# Patient Record
Sex: Female | Born: 1970 | Race: White | Hispanic: No | Marital: Married | Smoking: Former smoker
Health system: Southern US, Community
[De-identification: ages and names within clinical notes are randomized; demographics above are authoritative.]

## PROBLEM LIST (undated history)

## (undated) DIAGNOSIS — F988 Other specified behavioral and emotional disorders with onset usually occurring in childhood and adolescence: Secondary | ICD-10-CM

## (undated) DIAGNOSIS — M545 Low back pain, unspecified: Secondary | ICD-10-CM

## (undated) HISTORY — PX: ABDOMINOPLASTY: SHX5355

## (undated) HISTORY — PX: ROTATOR CUFF REPAIR: SHX139

---

## 2011-06-03 ENCOUNTER — Emergency Department
Admit: 2011-06-03 | Discharge: 2011-06-03 | Disposition: A | Discharge status: Home / Self Care | Payer: BC Managed Care – PPO | Attending: Family Medicine | Admitting: Family Medicine

## 2011-06-03 ENCOUNTER — Emergency Department (INDEPENDENT_AMBULATORY_CARE_PROVIDER_SITE_OTHER)
Admission: EM | Admit: 2011-06-03 | Discharge: 2011-06-03 | Disposition: A | Discharge status: Home / Self Care | Payer: BC Managed Care – PPO | Source: Home / Self Care | Attending: Family Medicine | Admitting: Family Medicine

## 2011-06-03 ENCOUNTER — Encounter: Payer: Self-pay | Admitting: *Deleted

## 2011-06-03 DIAGNOSIS — J209 Acute bronchitis, unspecified: Secondary | ICD-10-CM

## 2011-06-03 HISTORY — DX: Other specified behavioral and emotional disorders with onset usually occurring in childhood and adolescence: F98.8

## 2011-06-03 MED ORDER — SULFAMETHOXAZOLE-TRIMETHOPRIM 800-160 MG PO TABS: 1.0000 | ORAL_TABLET | Freq: Two times a day (BID) | ORAL | Status: AC

## 2011-06-03 MED ORDER — HYDROCOD POLST-CHLORPHEN POLST 10-8 MG/5ML PO LQCR: 5.0000 mL | Freq: Every evening | ORAL | Status: DC | PRN

## 2011-06-03 NOTE — ED Notes (Signed)
Patient c/o productive cough x 2 weeks. She has taken hycodan, tessalon and delsym. Also c/o dull pain in left mid back that started after the cough.

## 2011-06-03 NOTE — ED Provider Notes (Signed)
History     CSN: [id number]  Arrival date & time 06/03/11  1044   First MD Initiated Contact with Patient 06/03/11 1126      Chief Complaint  Patient presents with  . Cough      HPI Comments: Patient developed mild URI symptoms about a month ago, treated with a Z-pack by her PCP.  She has had a persistent mild cough which has become worse over the past two weeks, and she has developed a pain in her left scapular area, worse when coughing.  The cough is worse at night, not responding to Hycodan, Delsym, etc.  No fevers, chills, and sweats.  She often coughs until she gags (for about two days).  She had pneumonia about two years ago.  She smokes two cigarettes at bedtime. She reports that she had a booster Tdap one month ago.  The history is provided by the patient.    Past Medical History  Diagnosis Date  . ADD (attention deficit disorder)     Past Surgical History  Procedure Date  . Rotator cuff repair     left  . Abdominoplasty     Family History  Problem Relation Age of Onset  . Cancer Mother     lung    History  Substance Use Topics  . Smoking status: Current Everyday Smoker -- 0.2 packs/day    Types: Cigarettes  . Smokeless tobacco: Not on file  . Alcohol Use: No    OB History    Grav Para Term Preterm Abortions TAB SAB Ect Mult Living                  Review of Systems No sore throat at present + cough; occasionally gags No pleuritic pain, but complains of pain in left scapular area for two weeks. + wheezing at night No nasal congestion No post-nasal drainage No sinus pain/pressure No itchy/red eyes No earache No hemoptysis No SOB No fever/chills No nausea No vomiting No abdominal pain No diarrhea No urinary symptoms No skin rashes No fatigue No myalgias No headache Used OTC and prescription meds without relief  Allergies  Penicillins  Home Medications   Current Outpatient Rx  Name Route Sig Dispense Refill  . CITALOPRAM  HYDROBROMIDE 40 MG PO TABS Oral Take 40 mg by mouth daily.    Marland Kitchen ZOLPIDEM TARTRATE 10 MG PO TABS Oral Take 10 mg by mouth at bedtime as needed.    Marland Kitchen HYDROCOD POLST-CPM POLST ER 10-8 MG/5ML PO LQCR Oral Take 5 mLs by mouth at bedtime as needed. 115 mL 0  . SULFAMETHOXAZOLE-TRIMETHOPRIM 800-160 MG PO TABS Oral Take 1 tablet by mouth 2 (two) times daily. 28 tablet 0    BP 118/83  Pulse 80  Temp(Src) 98.9 F (37.2 C) (Oral)  Resp 14  Ht 5\' 4"  (1.626 m)  Wt 202 lb (91.627 kg)  BMI 34.67 kg/m2  SpO2 99%  Physical Exam Nursing notes and Vital Signs reviewed. Appearance:  Patient appears healthy, stated age, and in no acute distress Eyes:  Pupils are equal, round, and reactive to light and accomodation.  Extraocular movement is intact.  Conjunctivae are not inflamed  Ears:  Canals normal.  Tympanic membranes normal.  Nose:  Mildly congested turbinates.  No sinus tenderness.   Pharynx:  Normal Neck:  Supple.   No adenopathy Lungs:  Clear to auscultation.  Breath sounds are equal.  Heart:  Regular rate and rhythm without murmurs, rubs, or gallops.  Abdomen:  Nontender  without masses or hepatosplenomegaly.  Bowel sounds are present.  No CVA or flank tenderness.  Extremities:  No edema.  No calf tenderness Skin:  No rash present.   ED Course  Procedures  none  Labs Reviewed - No data to display Dg Chest 2 View  06/03/2011  *RADIOLOGY REPORT*  Clinical Data: Cough for 2 weeks.  CHEST - 2 VIEW  Comparison: None.  Findings: The heart size and mediastinal contours are normal. The lungs are clear. There is no pleural effusion or pneumothorax. No acute osseous findings are identified.  IMPRESSION: No active cardiopulmonary process.  Original Report Authenticated By: Gerrianne Scale, M.D.     1. Acute bronchitis       MDM  ? Pertussis Begin Septra DS for two weeks.  Tussionex at bedtime Take plain Mucinex (guaifenesin) twice daily for cough and congestion.  Increase fluid intake,  rest. Followup with pulmonologist if not improving        Donna Christen, MD 06/03/11 1244

## 2012-02-08 ENCOUNTER — Emergency Department (INDEPENDENT_AMBULATORY_CARE_PROVIDER_SITE_OTHER)
Admission: EM | Admit: 2012-02-08 | Discharge: 2012-02-08 | Disposition: A | Discharge status: Home / Self Care | Payer: BC Managed Care – PPO | Source: Home / Self Care

## 2012-02-08 ENCOUNTER — Encounter: Payer: Self-pay | Admitting: *Deleted

## 2012-02-08 ENCOUNTER — Emergency Department (INDEPENDENT_AMBULATORY_CARE_PROVIDER_SITE_OTHER): Payer: BC Managed Care – PPO

## 2012-02-08 DIAGNOSIS — M549 Dorsalgia, unspecified: Secondary | ICD-10-CM

## 2012-02-08 DIAGNOSIS — R079 Chest pain, unspecified: Secondary | ICD-10-CM

## 2012-02-08 DIAGNOSIS — M545 Low back pain, unspecified: Secondary | ICD-10-CM

## 2012-02-08 DIAGNOSIS — T148XXA Other injury of unspecified body region, initial encounter: Secondary | ICD-10-CM

## 2012-02-08 DIAGNOSIS — R0781 Pleurodynia: Secondary | ICD-10-CM

## 2012-02-08 HISTORY — DX: Low back pain: M54.5

## 2012-02-08 HISTORY — DX: Low back pain, unspecified: M54.50

## 2012-02-08 MED ORDER — KETOROLAC TROMETHAMINE 60 MG/2ML IM SOLN
60.0000 mg | Freq: Once | INTRAMUSCULAR | Status: AC
  Administered 2012-02-08: 60 mg via INTRAMUSCULAR

## 2012-02-08 NOTE — ED Provider Notes (Signed)
History     CSN: [id number]  Arrival date & time 02/08/12  1204   First MD Initiated Contact with Patient 02/08/12 1233      Chief Complaint  Patient presents with  . Back Pain   Patient is a 41 y.o. female presenting with back pain.  Back Pain  This is a recurrent (Pt with hx/o recurrent low back pain. Currently being treated by PCP. On vicodin and flexeril. ) problem. The problem occurs constantly. The pain is associated with lifting heavy objects. The pain is present in the thoracic spine (L rib cage ). The quality of the pain is described as aching. Radiates to: around L rib cage.  The pain is mild. The symptoms are aggravated by twisting and certain positions. Pertinent negatives include no chest pain, no fever, no paresthesias, no paresis, no tingling and no weakness. Associated symptoms comments: No rash or tingling  . She has tried NSAIDs (rest) for the symptoms. The treatment provided mild relief. Risk factors: hx/o chronic LBP.    Past Medical History  Diagnosis Date  . ADD (attention deficit disorder)   . Low back pain     Past Surgical History  Procedure Date  . Rotator cuff repair     left  . Abdominoplasty     Family History  Problem Relation Age of Onset  . Cancer Mother     lung  . Diabetes Brother     History  Substance Use Topics  . Smoking status: Current Every Day Smoker -- 0.2 packs/day    Types: Cigarettes  . Smokeless tobacco: Not on file  . Alcohol Use: No    OB History    Grav Para Term Preterm Abortions TAB SAB Ect Mult Living                  Review of Systems  Constitutional: Negative for fever.  Cardiovascular: Negative for chest pain.  Musculoskeletal: Positive for back pain.  Neurological: Negative for tingling, weakness and paresthesias.  All other systems reviewed and are negative.    Allergies  Penicillins  Home Medications   Current Outpatient Rx  Name Route Sig Dispense Refill  . HYDROCOD POLST-CPM POLST ER 10-8  MG/5ML PO LQCR Oral Take 5 mLs by mouth at bedtime as needed. 115 mL 0  . CITALOPRAM HYDROBROMIDE 40 MG PO TABS Oral Take 20 mg by mouth daily.     Marland Kitchen ZOLPIDEM TARTRATE 10 MG PO TABS Oral Take 10 mg by mouth at bedtime as needed.      BP 128/84  Pulse 79  Temp 98.9 F (37.2 C) (Oral)  Resp 14  Ht 5\' 4"  (1.626 m)  Wt 197 lb (89.359 kg)  BMI 33.82 kg/m2  SpO2 99%  LMP 02/03/2012  Physical Exam  Constitutional: She appears well-developed and well-nourished.  HENT:  Head: Normocephalic and atraumatic.  Eyes: Conjunctivae normal are normal. Pupils are equal, round, and reactive to light.  Neck: Normal range of motion. Neck supple.  Cardiovascular: Normal rate and regular rhythm.   Pulmonary/Chest: Effort normal and breath sounds normal.         + TTP along distribution.  + Pain with lateral rotation.  Mild L rib pain with arm abduction.   Abdominal: Soft. Bowel sounds are normal.  Musculoskeletal: Normal range of motion.    ED Course  Procedures (including critical care time)  Labs Reviewed - No data to display Dg Ribs Unilateral W/chest Left  02/08/2012  *RADIOLOGY REPORT*  Clinical  Data: Left lower back and rib pain.  No known injury.  LEFT RIBS AND CHEST - 3+ VIEW  Comparison: 06/03/2011  Findings: Heart and mediastinal contours are within normal limits. No focal opacities or effusions.  No acute bony abnormality.  No visible rib fracture.  No pneumothorax.  IMPRESSION: No active cardiopulmonary disease.   Original Report Authenticated By: Cyndie Chime, M.D.      1. Rib pain on left side   2. Musculoskeletal strain       MDM  Suspect likely musculoskeletal strain given mechanism of injury.  Xrays negative for any focal intrathoracic process.  Toradol 60mg  IM x1.  RICE and NSAIDs.  No rash currently that would be consistent with zoster.  Discussed with pt that if rash occurs, to contact us to be placed on antiviral.  General red flags discussed.  Would consider  PT if this persists.       The patient and/or caregiver has been counseled thoroughly with regard to treatment plan and/or medications prescribed including dosage, schedule, interactions, rationale for use, and possible side effects and they verbalize understanding. Diagnoses and expected course of recovery discussed and will return if not improved as expected or if the condition worsens. Patient and/or caregiver verbalized understanding.             Doree Albee, MD 02/12/12 1130

## 2012-02-08 NOTE — ED Notes (Signed)
Patient c/o upper back pain x 4 days. She has been moving recently. She has chronic low back pain treated by her PCP so she has hydrocodone and flexeril that she has been taking for the pain. Pain is 10/10 when lying down. Pain radiates laterally

## 2013-07-07 IMAGING — CR DG RIBS W/ CHEST 3+V*L*
3 series · 3 of 3 positions shown · non-contrast
Comparison: 06/03/2011

CLINICAL DATA: Left lower back and rib pain.  No known injury.

LEFT RIBS AND CHEST - 3+ VIEW

[view not recorded (1 of 3)]
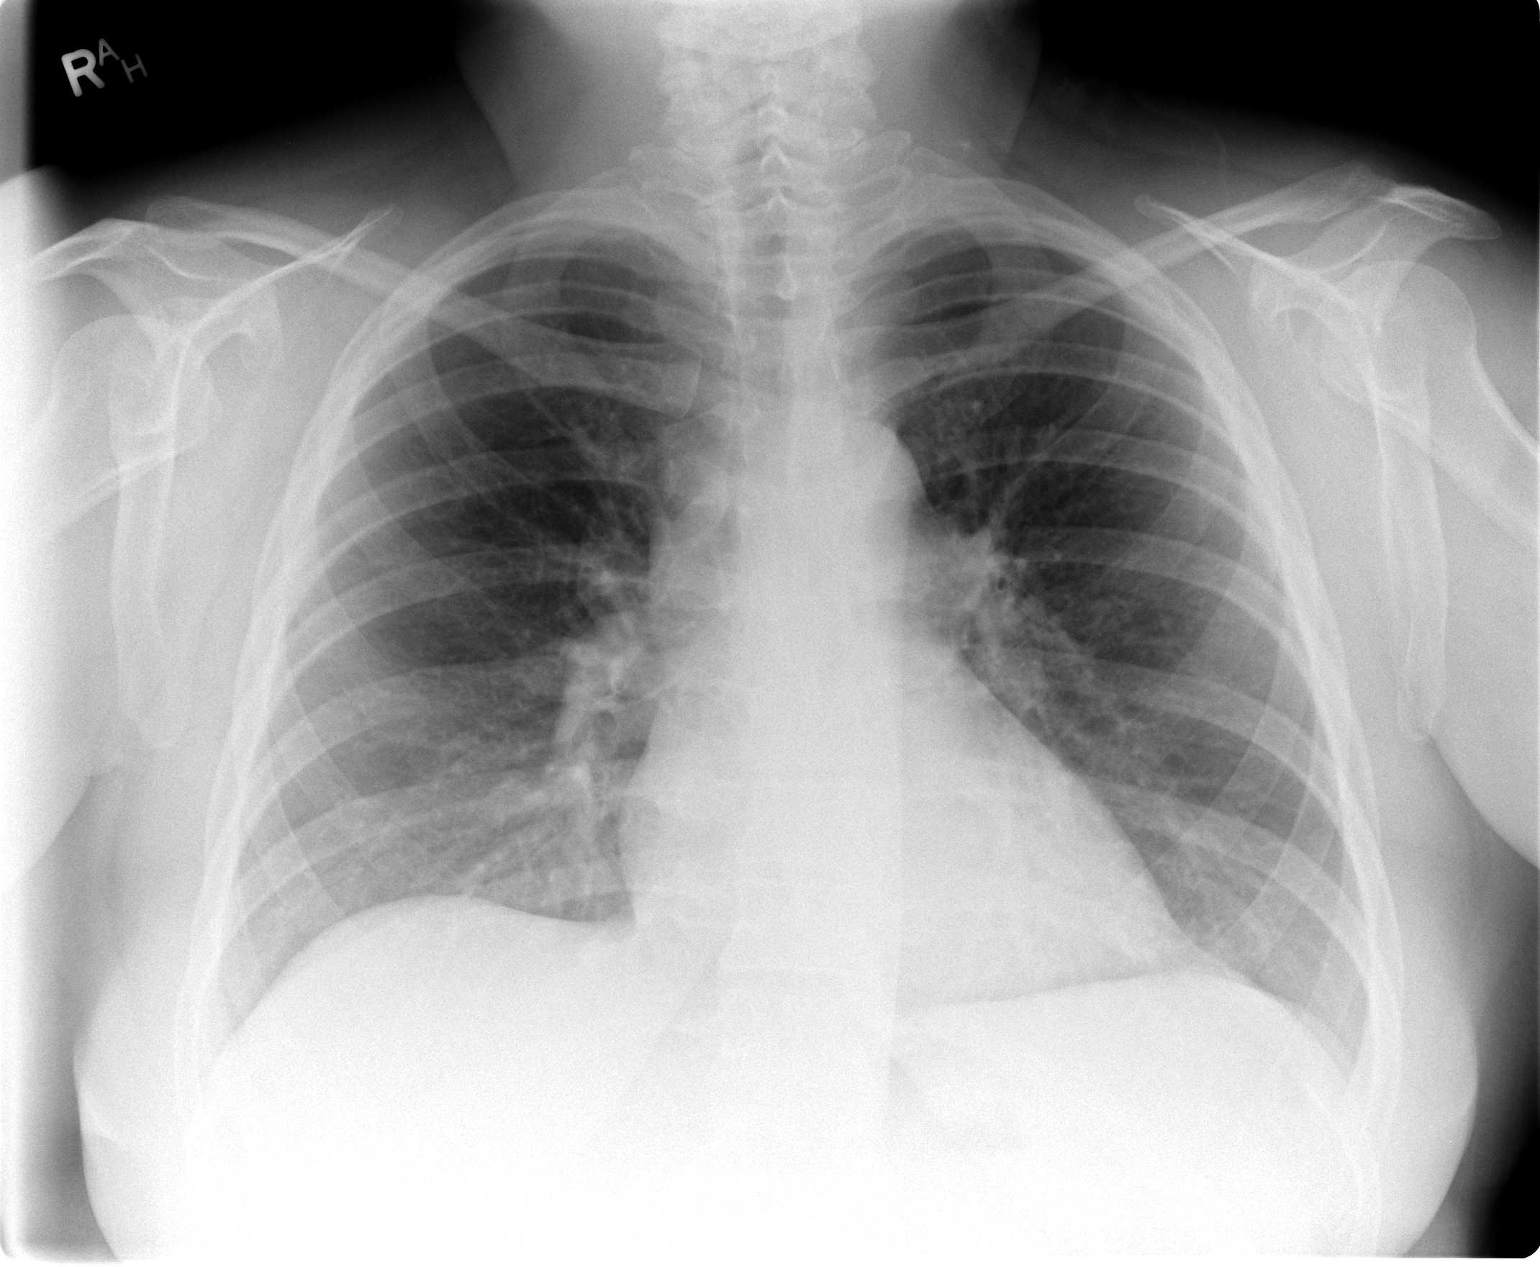

[view not recorded (2 of 3)]
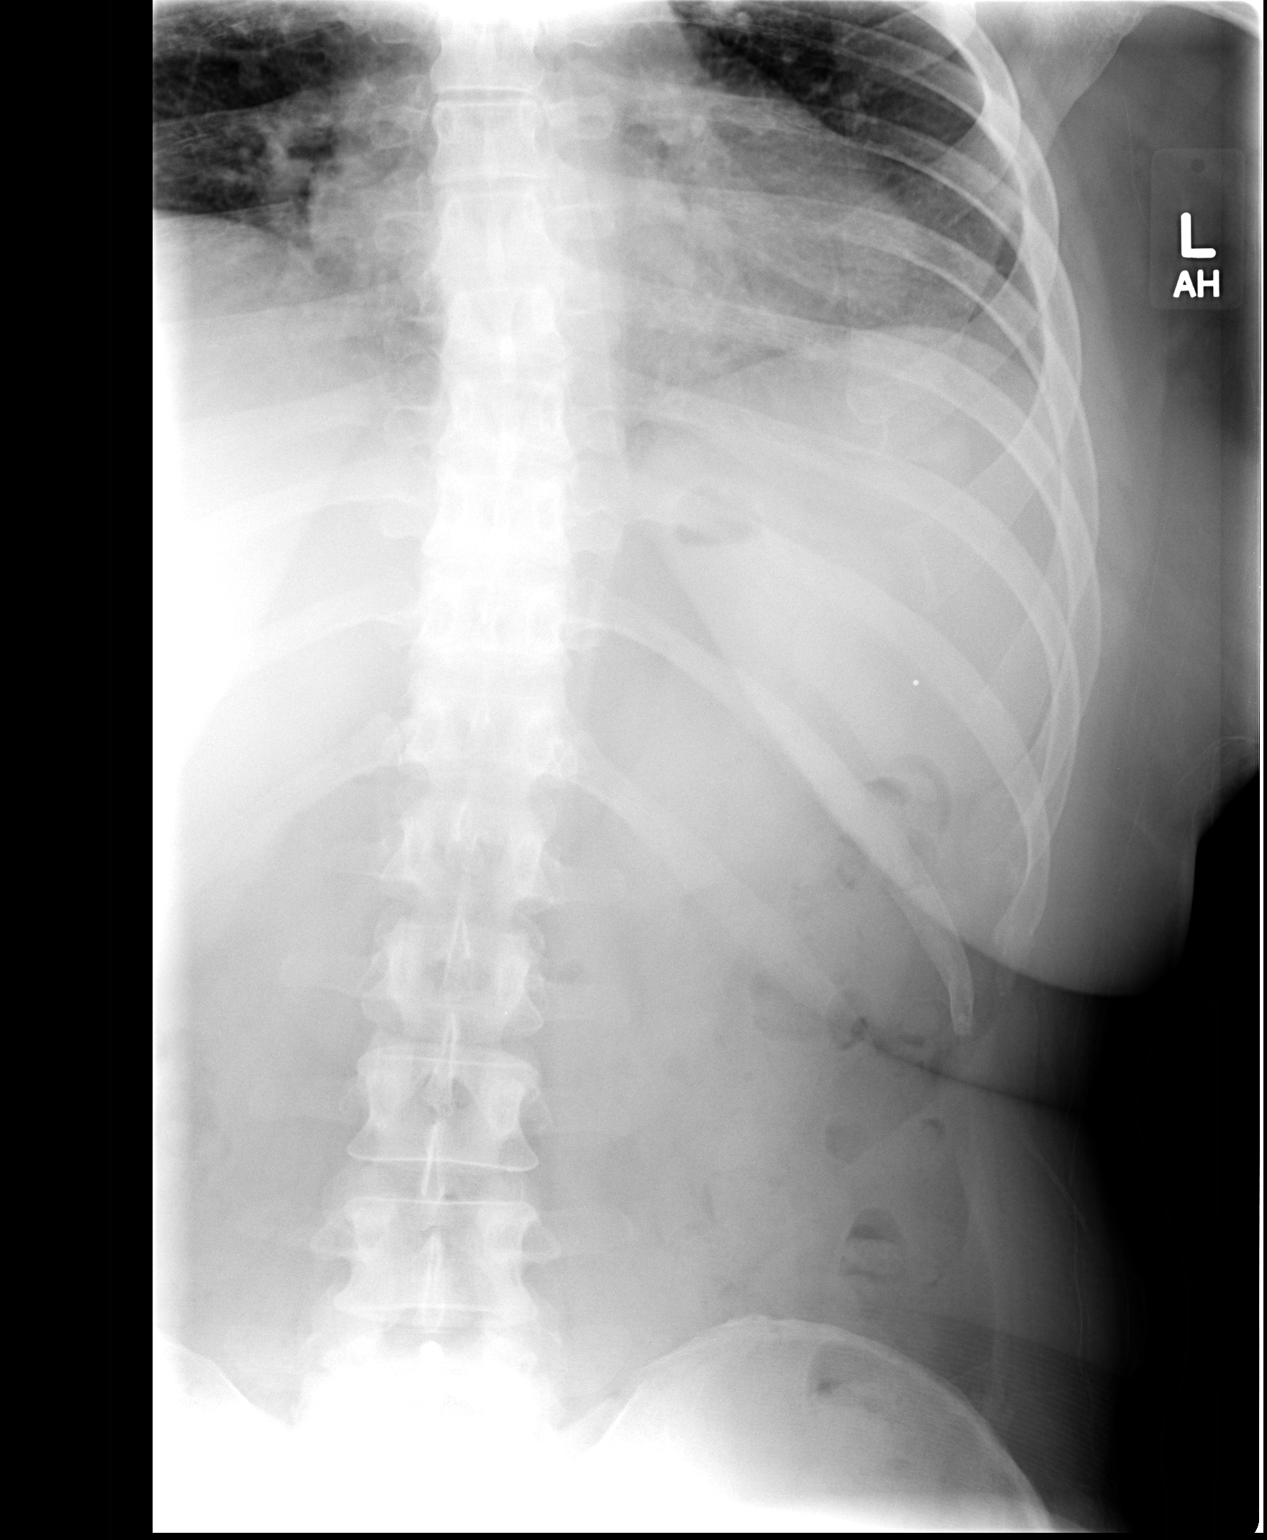

[view not recorded (3 of 3)]
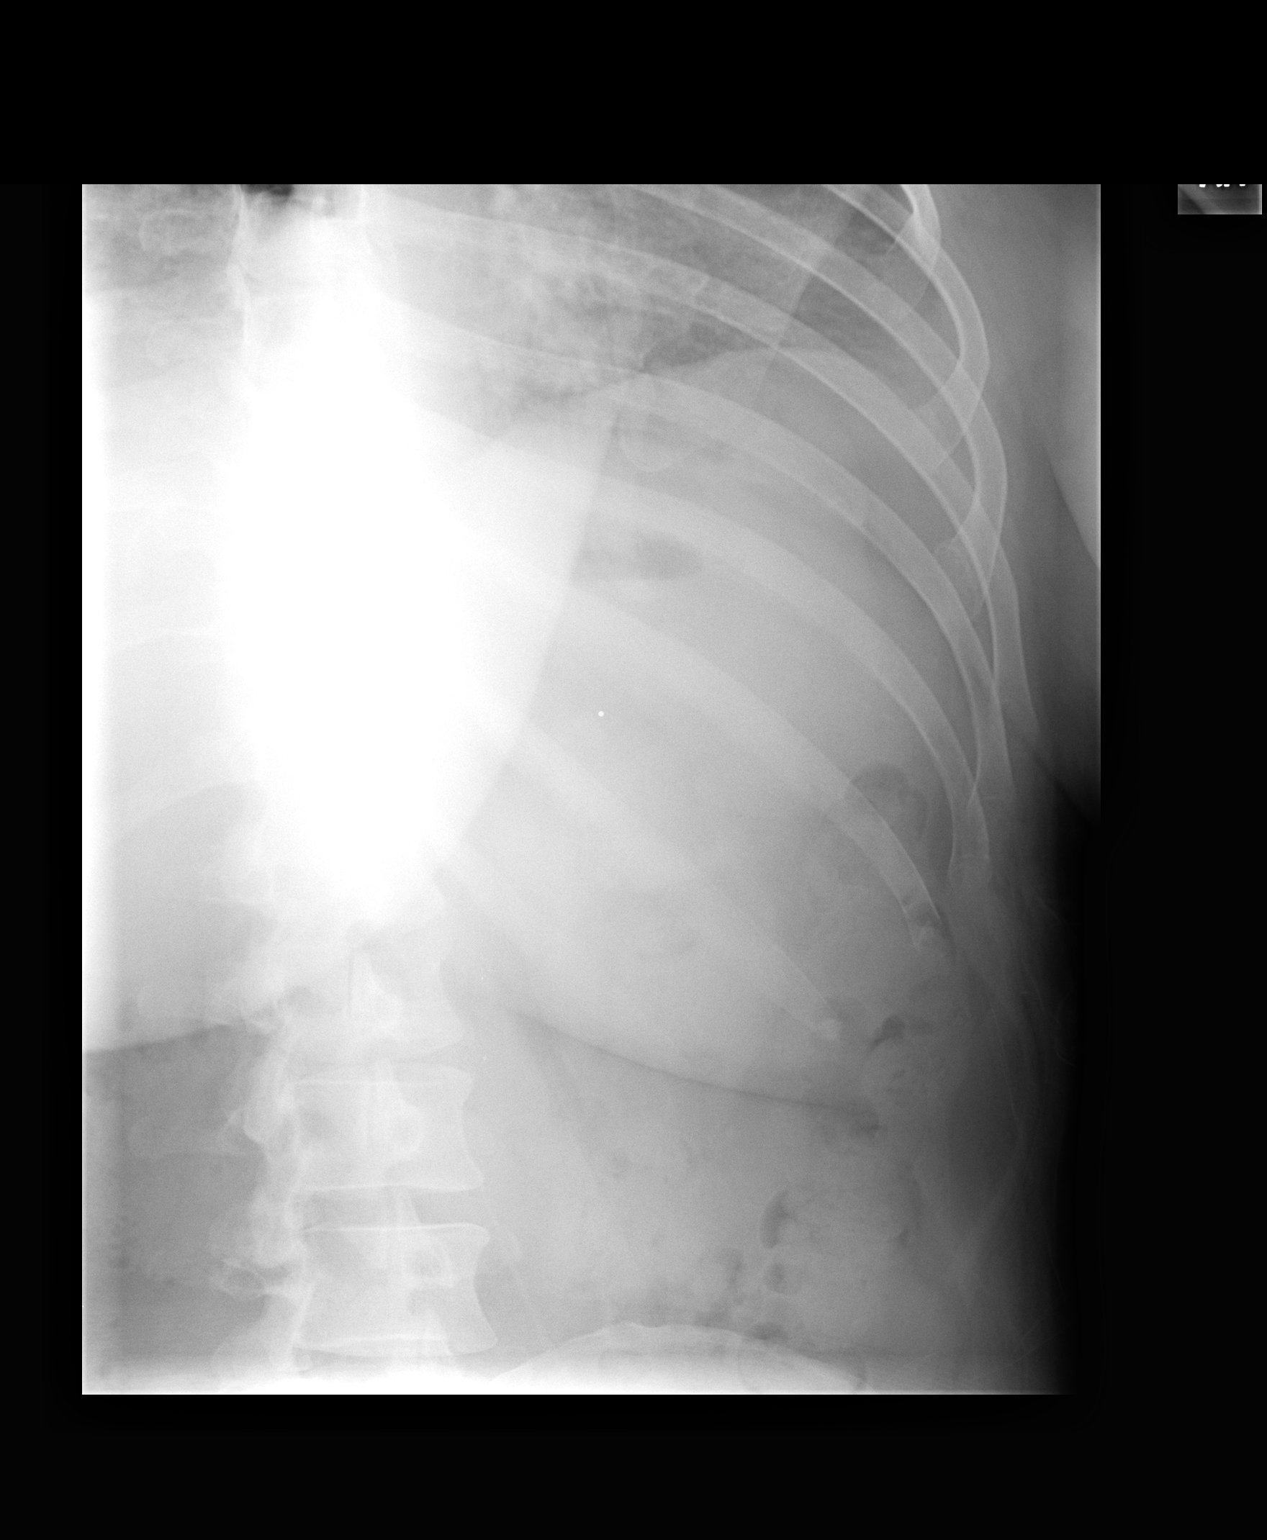

[3 of 3 positions shown; findings below may reference images not displayed]

FINDINGS: Heart and mediastinal contours are within normal limits.
No focal opacities or effusions.  No acute bony abnormality.  No
visible rib fracture.  No pneumothorax.
IMPRESSION: No active cardiopulmonary disease.

## 2014-06-16 ENCOUNTER — Emergency Department
Admission: EM | Admit: 2014-06-16 | Discharge: 2014-06-16 | Disposition: A | Discharge status: Home / Self Care | Payer: Self-pay | Source: Home / Self Care | Attending: Emergency Medicine | Admitting: Emergency Medicine

## 2014-06-16 ENCOUNTER — Encounter: Payer: Self-pay | Admitting: Emergency Medicine

## 2014-06-16 DIAGNOSIS — H65192 Other acute nonsuppurative otitis media, left ear: Secondary | ICD-10-CM | POA: Diagnosis not present

## 2014-06-16 LAB — POCT INFLUENZA A/B
INFLUENZA B, POC: NEGATIVE
Influenza A, POC: NEGATIVE

## 2014-06-16 LAB — POCT RAPID STREP A (OFFICE): RAPID STREP A SCREEN: NEGATIVE

## 2014-06-16 MED ORDER — CEPHALEXIN 500 MG PO CAPS: 500.0000 mg | ORAL_CAPSULE | Freq: Four times a day (QID) | ORAL | Status: DC

## 2014-06-16 NOTE — ED Notes (Signed)
Patient reports 2 days history of aches, sore throat, chills; took Mucinex D this morning.

## 2014-06-16 NOTE — Discharge Instructions (Signed)

## 2014-06-16 NOTE — ED Provider Notes (Signed)
CSN: [id number]     Arrival date & time 06/16/14  1146 History   First MD Initiated Contact with Patient 06/16/14 1224     Chief Complaint  Patient presents with  . Chills  . Generalized Body Aches   (Consider location/radiation/quality/duration/timing/severity/associated sxs/prior Treatment) Patient is a 44 y.o. female presenting with pharyngitis. The history is provided by the patient.  Sore Throat This is a new problem. The current episode started 2 days ago. The problem occurs constantly. The problem has been gradually worsening. Pertinent negatives include no headaches. Nothing aggravates the symptoms. Nothing relieves the symptoms. She has tried nothing for the symptoms.   patient complains of pain in right and left ear pain was worse in the right ear 2 days ago patient reports increasing pain in her left ear today. Patient complains of a sore throat she took Mucinex today with no relief. Also has a sore to the right side of her mouth she has been using Benadryl ointment without any relief  Past Medical History  Diagnosis Date  . ADD (attention deficit disorder)   . Low back pain    Past Surgical History  Procedure Laterality Date  . Rotator cuff repair      left  . Abdominoplasty     Family History  Problem Relation Age of Onset  . Cancer Mother     lung  . Diabetes Brother    History  Substance Use Topics  . Smoking status: Current Every Day Smoker -- 0.25 packs/day    Types: Cigarettes  . Smokeless tobacco: Not on file  . Alcohol Use: No   OB History    No data available     Review of Systems  Neurological: Negative for headaches.  All other systems reviewed and are negative.   Allergies  Penicillins  Home Medications   Prior to Admission medications   Medication Sig Start Date End Date Taking? Authorizing Provider  ALPRAZolam Prudy Feeler) 0.5 MG tablet Take 0.5 mg by mouth at bedtime as needed for anxiety.   Yes Historical Provider, MD  cyclobenzaprine  (FLEXERIL) 10 MG tablet Take 10 mg by mouth at bedtime.   Yes Historical Provider, MD  cephALEXin (KEFLEX) 500 MG capsule Take 1 capsule (500 mg total) by mouth 4 (four) times daily. 06/16/14   Elson Areas, PA-C  chlorpheniramine-HYDROcodone [facility]) 10-8 MG/5ML LQCR Take 5 mLs by mouth at bedtime as needed. 06/03/11   Lattie Haw, MD  citalopram (CELEXA) 40 MG tablet Take 20 mg by mouth daily.     Historical Provider, MD  zolpidem (AMBIEN) 10 MG tablet Take 10 mg by mouth at bedtime as needed.    Historical Provider, MD   BP 123/83 mmHg  Pulse 99  Temp(Src) 98.9 F (37.2 C) (Oral)  Resp 16  Ht  (1.626 m)  Wt 186 lb (84.369 kg)  BMI 31.91 kg/m2  SpO2 98%  LMP 02/03/2012 Physical Exam  Constitutional: She is oriented to person, place, and time. She appears well-developed and well-nourished.  HENT:  Head: Normocephalic and atraumatic.  Nose: Nose normal.  Mouth/Throat: Oropharynx is clear and moist.  Right TM is clear left TM is erythematous slightly retracted  Eyes: Conjunctivae and EOM are normal. Pupils are equal, round, and reactive to light.  Neck: Normal range of motion.  Cardiovascular: Normal rate and normal heart sounds.   Pulmonary/Chest: Effort normal.  Abdominal: She exhibits no distension.  Musculoskeletal: Normal range of motion.  Neurological: She is alert  and oriented to person, place, and time.  Skin: Skin is warm.  Psychiatric: She has a normal mood and affect.  Nursing note and vitals reviewed.   ED Course  Procedures (including critical care time) Labs Review Labs Reviewed - No data to display  Imaging Review No results found.   MDM   1. Acute nonsuppurative otitis media of left ear     Zithromax Rx I advised to use Vaseline to lips 3 times a day apply hydrocortisone ointment to corner of mouth twice a day Patient advised to follow-up with her primary care doctor for recheck in 3-4 days if not improving ABS   Elson AreasLeslie K  Wynter Grave, PA-C 06/16/14 1245

## 2024-04-02 ENCOUNTER — Other Ambulatory Visit: Payer: Self-pay

## 2024-04-02 ENCOUNTER — Ambulatory Visit: Admission: RE | Admit: 2024-04-02 | Discharge: 2024-04-02 | Disposition: A | Discharge status: Home / Self Care

## 2024-04-02 VITALS — BP 131/87 | HR 68 | Temp 98.0°F | Resp 16

## 2024-04-02 DIAGNOSIS — N898 Other specified noninflammatory disorders of vagina: Secondary | ICD-10-CM

## 2024-04-02 DIAGNOSIS — R102 Pelvic and perineal pain unspecified side: Secondary | ICD-10-CM

## 2024-04-02 MED ORDER — LIDOCAINE HCL 5 % EX GEL: 1.0000 | Freq: Four times a day (QID) | CUTANEOUS | 0 refills | Status: AC | PRN

## 2024-04-02 NOTE — ED Provider Notes (Signed)
 [facility]  Note:  This document was prepared using Dragon voice recognition software and may include unintentional dictation errors.  MRN: [id number] DOB: [birth year] DATE: 04/02/24   Subjective:  Chief Complaint:  Chief Complaint  Patient presents with   Vaginal Itching    I have a sore. - Entered by patient    HPI: Colleen Baxter is a 53 y.o. female presenting for a vaginal lesion for the past 3 days. Patient states she started with vaginal irritation 3 days ago that she thought was developing a yeast infection. Patient reports she was taking a shower and noticed a lesion in her vaginal area soon after. Patient has been applying tea tree and lavender oil with little relief. She thought she was improving until today the pain became worse. She reports being married for 20 years and has little concern for STDs. She reports no prior lesions in the past. 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 She had been at the gym just prior to onset of symptoms. She states she last had sex about 3 months ago and had some discomfort at the time. She attributed to recent menopause.  Pain worse with palpation and wiping.  Denies fever, nausea/vomiting, abdominal pain, discharge. Endorses vaginal lesion, vulvar pain. Presents NAD.  Prior to Admission medications   Medication Sig Start Date End Date Taking? Authorizing Provider  methocarbamol (ROBAXIN) 750 MG tablet Take 750 mg by mouth 1 day or 1 dose.   Yes [provider]  venlafaxine (EFFEXOR) 37.5 MG tablet Take 37.5 mg by mouth 1 day or 1 dose.   Yes [provider]  zolpidem (AMBIEN) 10 MG tablet Take 10 mg by mouth at bedtime as needed.   Yes [provider]     Allergies  Allergen Reactions   Penicillins     History:   Past Medical History:  Diagnosis Date   ADD (attention deficit disorder)    Low back pain      Past Surgical History:  Procedure Laterality Date    ABDOMINOPLASTY     ROTATOR CUFF REPAIR     left    Family History  Problem Relation Age of Onset   Cancer Mother        lung   Diabetes Brother     Social History   Tobacco Use   Smoking status: Former    Current packs/day: 0.25    Types: Cigarettes  Substance Use Topics   Alcohol use: No   Drug use: No    Review of Systems  Constitutional:  Negative for fever.  Gastrointestinal:  Negative for abdominal pain, nausea and vomiting.  Genitourinary:  Positive for genital sores and vaginal pain. Negative for dysuria and vaginal discharge.     Objective:   Vitals: BP 131/87 (BP Location: Right Arm)   Pulse 68   Temp 98 F (36.7 C) (Oral)   Resp 16   LMP 02/03/2012   SpO2 99%   Physical Exam Exam conducted with a chaperone present Isidoro Knee, RN).  Constitutional:      General: She is not in acute distress.    Appearance: Normal appearance. She is well-developed and normal weight. She is not ill-appearing or toxic-appearing.  HENT:     Head: Normocephalic and atraumatic.  Cardiovascular:     Rate and Rhythm: Normal rate and regular rhythm.     Heart sounds: Normal heart sounds.  Pulmonary:     Effort: Pulmonary effort is  normal.     Breath sounds: Normal breath sounds.     Comments: Clear to auscultation bilaterally  Abdominal:     General: Bowel sounds are normal.     Palpations: Abdomen is soft.     Tenderness: There is no abdominal tenderness.  Genitourinary:    Labia:        Right: Lesion present.       Comments: Patient has ulceration/abrasion to right posterior labia minor. Sloughing noted at center of the lesion. TTP. No warmth, erythema, or discharge. Skin:    General: Skin is warm and dry.     Findings: Lesion present.  Neurological:     General: No focal deficit present.     Mental Status: She is alert.  Psychiatric:        Mood and Affect: Mood and affect normal.     Results:  Labs: No results found for this or any previous visit  (from the past 24 hours).  Radiology: No results found.   UC Course/Treatments:  Procedures: Procedures   Medications Ordered in UC: Medications - No data to display   Assessment and Plan :     ICD-10-CM   1. Vaginal lesion  N89.8 RPR    HSV 1/2 PCR (Surface)    RPR    HSV 1/2 PCR (Surface)    2. Vulvar pain  R10.20       Vaginal lesion Vulvar Pain Afebrile, nontoxic-appearing, NAD. VSS. DDX includes but not limited to: HSV, syphilis, chancroid, abrasion, ulceration, tear Patient has what appears to be ulceration of right posterior labia minor. Sloughing present, but no discharge. TTP. Low suspicion of HSV at this time given appearance, but cytology is pending as well as RPR. Suspect abrasion/irritation from frequent work outs and recently undergoing menopause. Lidocaine  gel 5% QID PRN was prescribed for pain while lesion is healing. Recommend OTC analgesics as needed for pain. If no improvement, follow up with GYN. Will adjust treatment plan based on results. Patient not concerned with STD testing at this time. Strict ED precautions were given and patient verbalized understanding.  ED Discharge Orders          Ordered    Lidocaine  HCl 5 % GEL  4 times daily PRN        04/02/24 1652             PDMP not reviewed this encounter.     Maitlyn Penza P, PA-C 04/02/24 1706

## 2024-04-02 NOTE — Discharge Instructions (Signed)
 Your blood work and swab were sent to the lab for further testing. Someone from our office will call you with your results.   You have been prescribed a numbing medication for your vaginal lesion. This will help with the pain while it heals. If not allergic, you may use over the counter ibuprofen or acetaminophen as needed. You can use the medicine up to 4 times a day. I would not use it more than a week. If no improvement, recommend follow up with GYN.

## 2024-04-02 NOTE — ED Triage Notes (Signed)
 Patient C/O pimple or sore area to the outside of her perineal area. Onset three days ago. Patient  used tea tree and lavender oil to the are with slight relief.

## 2024-04-02 NOTE — ED Notes (Signed)
 Chaperone exam with Ashlee PA. Swab collected and sent to lab.

## 2024-04-03 ENCOUNTER — Ambulatory Visit: Payer: Self-pay | Admitting: Internal Medicine

## 2024-04-03 ENCOUNTER — Telehealth: Payer: Self-pay

## 2024-04-03 LAB — HSV 1/2 PCR (SURFACE)
HSV-1 DNA: NOT DETECTED
HSV-2 DNA: DETECTED — AB

## 2024-04-03 LAB — SYPHILIS: RPR W/REFLEX TO RPR TITER AND TREPONEMAL ANTIBODIES, TRADITIONAL SCREENING AND DIAGNOSIS ALGORITHM: RPR Ser Ql: NONREACTIVE

## 2024-04-03 MED ORDER — VALACYCLOVIR HCL 1 G PO TABS: 1000.0000 mg | ORAL_TABLET | Freq: Two times a day (BID) | ORAL | 0 refills | Status: AC

## 2024-04-03 NOTE — Telephone Encounter (Signed)
 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 I advised her that an RX for valtrex  was being sent to her pharmacy for her to pick up. Patient verbalized understanding of necessary precautions and was advised to complete the entire course of medication.

## 2024-04-03 NOTE — Telephone Encounter (Signed)
 RX sent for valtrex  given positive HSV swab
